# Patient Record
Sex: Male | Born: 2000 | Race: White | Hispanic: No | Marital: Single | Smoking: Current every day smoker
Health system: Southern US, Community
[De-identification: ages and names within clinical notes are randomized; demographics above are authoritative.]

## PROBLEM LIST (undated history)

## (undated) DIAGNOSIS — R519 Headache, unspecified: Secondary | ICD-10-CM

## (undated) HISTORY — DX: Headache, unspecified: R51.9

---
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
## 2019-06-29 ENCOUNTER — Emergency Department (HOSPITAL_COMMUNITY)
Admission: EM | Admit: 2019-06-29 | Discharge: 2019-06-29 | Disposition: A | Discharge status: Home / Self Care | Payer: No Typology Code available for payment source | Attending: Emergency Medicine | Admitting: Emergency Medicine

## 2019-06-29 ENCOUNTER — Encounter (HOSPITAL_COMMUNITY): Payer: Self-pay | Admitting: Emergency Medicine

## 2019-06-29 ENCOUNTER — Emergency Department (HOSPITAL_COMMUNITY): Payer: No Typology Code available for payment source

## 2019-06-29 DIAGNOSIS — Y999 Unspecified external cause status: Secondary | ICD-10-CM | POA: Diagnosis not present

## 2019-06-29 DIAGNOSIS — Y93I9 Activity, other involving external motion: Secondary | ICD-10-CM | POA: Diagnosis not present

## 2019-06-29 DIAGNOSIS — Y92410 Unspecified street and highway as the place of occurrence of the external cause: Secondary | ICD-10-CM | POA: Insufficient documentation

## 2019-06-29 DIAGNOSIS — M545 Low back pain, unspecified: Secondary | ICD-10-CM

## 2019-06-29 DIAGNOSIS — Z79899 Other long term (current) drug therapy: Secondary | ICD-10-CM | POA: Diagnosis not present
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
## 2019-06-29 NOTE — Discharge Instructions (Signed)
Please read attached information. If you experience any new or worsening signs or symptoms please return to the emergency room for evaluation. Please follow-up with your primary care provider or specialist as discussed.  °
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
## 2021-06-05 ENCOUNTER — Encounter (HOSPITAL_COMMUNITY): Payer: Self-pay

## 2021-06-05 ENCOUNTER — Other Ambulatory Visit: Payer: Self-pay

## 2021-06-05 ENCOUNTER — Emergency Department (HOSPITAL_COMMUNITY)
Admission: EM | Admit: 2021-06-05 | Discharge: 2021-06-06 | Disposition: A | Discharge status: Home / Self Care | Payer: Medicaid Other | Attending: Emergency Medicine | Admitting: Emergency Medicine

## 2021-06-05 ENCOUNTER — Emergency Department (HOSPITAL_COMMUNITY): Payer: Medicaid Other

## 2021-06-05 DIAGNOSIS — R2232 Localized swelling, mass and lump, left upper limb: Secondary | ICD-10-CM | POA: Insufficient documentation

## 2021-06-05 DIAGNOSIS — R61 Generalized hyperhidrosis: Secondary | ICD-10-CM | POA: Insufficient documentation

## 2021-06-05 DIAGNOSIS — R5383 Other fatigue: Secondary | ICD-10-CM | POA: Insufficient documentation

## 2021-06-05 DIAGNOSIS — Z5321 Procedure and treatment not carried out due to patient leaving prior to being seen by health care provider: Secondary | ICD-10-CM | POA: Insufficient documentation

## 2021-06-05 DIAGNOSIS — R630 Anorexia: Secondary | ICD-10-CM | POA: Insufficient documentation

## 2021-06-05 LAB — CBC WITH DIFFERENTIAL/PLATELET
Abs Immature Granulocytes: 0.01 10*3/uL (ref 0.00–0.07)
Basophils Absolute: 0 10*3/uL (ref 0.0–0.1)
Basophils Relative: 1 %
Eosinophils Absolute: 0 10*3/uL (ref 0.0–0.5)
Eosinophils Relative: 0 %
HCT: 43.6 % (ref 39.0–52.0)
Hemoglobin: 14.1 g/dL (ref 13.0–17.0)
Immature Granulocytes: 0 %
Lymphocytes Relative: 35 %
Lymphs Abs: 2.3 10*3/uL (ref 0.7–4.0)
MCH: 30.3 pg (ref 26.0–34.0)
MCHC: 32.3 g/dL (ref 30.0–36.0)
MCV: 93.6 fL (ref 80.0–100.0)
Monocytes Absolute: 0.7 10*3/uL (ref 0.1–1.0)
Monocytes Relative: 10 %
Neutro Abs: 3.6 10*3/uL (ref 1.7–7.7)
Neutrophils Relative %: 54 %
Platelets: 236 10*3/uL (ref 150–400)
RBC: 4.66 MIL/uL (ref 4.22–5.81)
RDW: 12.5 % (ref 11.5–15.5)
WBC: 6.6 10*3/uL (ref 4.0–10.5)
nRBC: 0 % (ref 0.0–0.2)

## 2021-06-05 LAB — COMPREHENSIVE METABOLIC PANEL
ALT: 15 U/L (ref 0–44)
AST: 21 U/L (ref 15–41)
Albumin: 4.4 g/dL (ref 3.5–5.0)
Alkaline Phosphatase: 51 U/L (ref 38–126)
Anion gap: 7 (ref 5–15)
BUN: 15 mg/dL (ref 6–20)
CO2: 24 mmol/L (ref 22–32)
Calcium: 9.2 mg/dL (ref 8.9–10.3)
Chloride: 106 mmol/L (ref 98–111)
Creatinine, Ser: 0.82 mg/dL (ref 0.61–1.24)
GFR, Estimated: >60 mL/min (ref >60)
Glucose, Bld: 97 mg/dL (ref 70–99)
Potassium: 3.8 mmol/L (ref 3.5–5.1)
Sodium: 137 mmol/L (ref 135–145)
Total Bilirubin: 0.4 mg/dL (ref 0.3–1.2)
Total Protein: 7 g/dL (ref 6.5–8.1)

## 2021-06-05 LAB — HIV ANTIBODY (ROUTINE TESTING W REFLEX): HIV Screen 4th Generation wRfx: NONREACTIVE

## 2021-06-05 NOTE — ED Triage Notes (Signed)
Pt presents with lump under his Left arm x1 week. Pt unable to lift his arm d/t increase pain. Reports hx of swollen neck glands in the past. Pt also c/o increase sweating at night, states, "I'm soaking my sheets", decrease appetite and more lethargic than normal

## 2021-06-05 NOTE — ED Provider Notes (Signed)
Emergency Medicine Provider Triage Evaluation Note  Henry Bailey , a 20 y.o. male  was evaluated in triage.  Pt complains of swelling to left axilla.  Also endorses night sweats x1 week, chills, decreased appetite, and fatigue.  Denies any cough, hemoptysis, unexpected weight loss, recent international travel.  Review of Systems  Positive: Swelling left axilla, night sweats, chills, decreased appetite, fatigue Negative: Cough, hemoptysis, unexpected weight loss  Physical Exam  BP 130/72 (BP Location: Right Arm)   Pulse 73   Temp 98.6 F (37 C) (Oral)   Resp 14   SpO2 97%  Gen:   Awake, no distress   Resp:  Normal effort, lungs clear to auscultation bilaterally. MSK:   Moves extremities without difficulty  Other:  Patient has swelling to left axilla, no fluctuance, erythema, or rash noted.  No cervical lymphadenopathy.  Neck supple, full range of motion.  Medical Decision Making  Medically screening exam initiated at 5:49 PM.  Appropriate orders placed.  Reef Achterberg was informed that the remainder of the evaluation will be completed by another provider, this initial triage assessment does not replace that evaluation, and the importance of remaining in the ED until their evaluation is complete.  The patient appears stable so that the remainder of the work up may be completed by another provider.      Haskel Schroeder, PA-C 06/05/21 1750    Maia Plan, MD 06/05/21 [phone]

## 2021-06-05 NOTE — ED Notes (Signed)
Patient called three times for vitals with no response

## 2021-06-06 ENCOUNTER — Emergency Department (HOSPITAL_COMMUNITY)
Admission: EM | Admit: 2021-06-06 | Discharge: 2021-06-06 | Disposition: A | Discharge status: Home / Self Care | Payer: Medicaid Other | Attending: Emergency Medicine | Admitting: Emergency Medicine

## 2021-06-06 ENCOUNTER — Other Ambulatory Visit: Payer: Self-pay

## 2021-06-06 ENCOUNTER — Encounter (HOSPITAL_COMMUNITY): Payer: Self-pay | Admitting: Emergency Medicine

## 2021-06-06 DIAGNOSIS — Z5321 Procedure and treatment not carried out due to patient leaving prior to being seen by health care provider: Secondary | ICD-10-CM | POA: Insufficient documentation

## 2021-06-06 DIAGNOSIS — N6331 Unspecified lump in axillary tail of the right breast: Secondary | ICD-10-CM | POA: Insufficient documentation

## 2021-06-06 NOTE — ED Notes (Signed)
Called pt 3 x no answer

## 2021-06-06 NOTE — ED Triage Notes (Signed)
Pt states he was seen in ED last night and left due to wait.  Reports lump to L axilla x 1 week.  States he has been having night sweats.  Denies fever.  Labs obtained yesterday.

## 2021-06-06 NOTE — ED Notes (Signed)
Called pt again without answer pulling pt off the floor

## 2022-03-05 ENCOUNTER — Ambulatory Visit (HOSPITAL_COMMUNITY)
Admission: EM | Admit: 2022-03-05 | Discharge: 2022-03-05 | Disposition: A | Discharge status: Home / Self Care | Payer: Self-pay | Attending: Family Medicine | Admitting: Family Medicine

## 2022-03-05 ENCOUNTER — Ambulatory Visit (INDEPENDENT_AMBULATORY_CARE_PROVIDER_SITE_OTHER): Payer: Self-pay

## 2022-03-05 ENCOUNTER — Encounter (HOSPITAL_COMMUNITY): Payer: Self-pay | Admitting: *Deleted

## 2022-03-05 ENCOUNTER — Other Ambulatory Visit: Payer: Self-pay

## 2022-03-05 DIAGNOSIS — M79641 Pain in right hand: Secondary | ICD-10-CM

## 2022-03-05 MED ORDER — IBUPROFEN 800 MG PO TABS: 800.0000 mg | ORAL_TABLET | Freq: Three times a day (TID) | ORAL | 0 refills | Status: DC | PRN

## 2022-03-05 NOTE — ED Provider Notes (Signed)
[facility]    CSN: [id number] Arrival date & time: 03/05/22  1223      History   Chief Complaint Chief Complaint  Patient presents with   Hand Pain    HPI Henry Bailey is a 21 y.o. male.    Hand Pain  Here for right hand pain that he first noted this morning.  Since injury that he remembers.  He does work Holiday representative.  He has injured it twice in the past the last time was about a year ago.  He does report that 1 time there was a fractur in his fifth metacarpal.  History reviewed. No pertinent past medical history.  There are no problems to display for this patient.   History reviewed. No pertinent surgical history.     Home Medications    Prior to Admission medications   Medication Sig Start Date End Date Taking? Authorizing Provider  ibuprofen (ADVIL) 800 MG tablet Take 1 tablet (800 mg total) by mouth every 8 (eight) hours as needed (pain). 03/05/22  Yes Zenia Resides, MD  acetaminophen (TYLENOL) 500 MG tablet Take 1,000 mg by mouth every 6 (six) hours as needed. For pain    [provider]  lisdexamfetamine (VYVANSE) 40 MG capsule Take 40 mg by mouth every morning.    [provider]    Family History History reviewed. No pertinent family history.  Social History Social History   Tobacco Use   Smoking status: Never   Smokeless tobacco: Never  Substance Use Topics   Alcohol use: Never   Drug use: Never     Allergies   Penicillins   Review of Systems Review of Systems   Physical Exam Triage Vital Signs ED Triage Vitals  Enc Vitals Group     BP 03/05/22 1356 132/74     Pulse Rate 03/05/22 1356 71     Resp 03/05/22 1356 18     Temp 03/05/22 1356 98.5 F (36.9 C)     Temp src --      SpO2 03/05/22 1356 100 %     Weight --      Height --      Head Circumference --      Peak Flow --      Pain Score 03/05/22 1354 7     Pain Loc --      Pain Edu? --      Excl. in GC? --    No data  found.  Updated Vital Signs BP 132/74   Pulse 71   Temp 98.5 F (36.9 C)   Resp 18   SpO2 100%   Visual Acuity Right Eye Distance:   Left Eye Distance:   Bilateral Distance:    Right Eye Near:   Left Eye Near:    Bilateral Near:     Physical Exam Vitals reviewed.  Constitutional:      General: He is not in acute distress.    Appearance: He is not toxic-appearing.  Musculoskeletal:     Comments: There is a nodular deformity in the area of the proximal fifth metacarpal on his right hand.  He is able to flex and extend his fingers though it does cause some discomfort.  He also notes some tenderness in the lateral border of the hand  Neurological:     General: No focal deficit present.     Mental Status: He is alert and oriented to person, place, and time.  Psychiatric:  Behavior: Behavior normal.     UC Treatments / Results  Labs (all labs ordered are listed, but only abnormal results are displayed) Labs Reviewed - No data to display  EKG   Radiology DG Hand Complete Right  Result Date: 03/05/2022 CLINICAL DATA:  Right hand pain along the ulnar aspect. Prior fracture. EXAM: RIGHT HAND - COMPLETE 3+ VIEW COMPARISON:  Right wrist radiographs 02/13/2021 FINDINGS: No acute fracture or dislocation is identified. Chronic deformity of the proximal aspect of the fifth metacarpal with marginal spurring is similar to the prior study. The soft tissues are unremarkable. IMPRESSION: No acute osseous abnormality identified. Electronically Signed   By: Sebastian Ache M.D.   On: 03/05/2022 14:26    Procedures Procedures (including critical care time)  Medications Ordered in UC Medications - No data to display  Initial Impression / Assessment and Plan / UC Course  I have reviewed the triage vital signs and the nursing notes.  Pertinent labs & imaging results that were available during my care of the patient were reviewed by me and considered in my medical decision making (see  chart for details).      X-ray does not show any new fracture.  There is arthritic deformity where he had the boxer's fracture previously nal Clinical Impressions(s) / UC Diagnoses   Final diagnoses:  Right hand pain     Discharge Instructions      Your x-ray did not show any new broken bone.  There is arthritis around where you broke the bone previously  Take ibuprofen 800 mg--1 tab every 8 hours as needed for pain.        ED Prescriptions     Medication Sig Dispense Auth. Provider   ibuprofen (ADVIL) 800 MG tablet Take 1 tablet (800 mg total) by mouth every 8 (eight) hours as needed (pain). 21 tablet Theophil Thivierge, Janace Aris, MD      PDMP not reviewed this encounter.   Zenia Resides, MD 03/05/22 [phone]

## 2022-03-05 NOTE — Discharge Instructions (Addendum)
Your x-ray did not show any new broken bone.  There is arthritis around where you broke the bone previously  Take ibuprofen 800 mg--1 tab every 8 hours as needed for pain.

## 2022-03-05 NOTE — ED Triage Notes (Signed)
Pain to rt hand that started today. Pt reports he has a broken rt hans x2 in the past.

## 2022-05-16 ENCOUNTER — Emergency Department (HOSPITAL_COMMUNITY)
Admission: EM | Admit: 2022-05-16 | Discharge: 2022-05-16 | Disposition: A | Discharge status: Home / Self Care | Payer: Medicaid Other | Attending: Emergency Medicine | Admitting: Emergency Medicine

## 2022-05-16 ENCOUNTER — Other Ambulatory Visit: Payer: Self-pay

## 2022-05-16 ENCOUNTER — Encounter (HOSPITAL_COMMUNITY): Payer: Self-pay | Admitting: Emergency Medicine

## 2022-05-16 DIAGNOSIS — R079 Chest pain, unspecified: Secondary | ICD-10-CM | POA: Insufficient documentation

## 2022-05-16 DIAGNOSIS — R1902 Left upper quadrant abdominal swelling, mass and lump: Secondary | ICD-10-CM | POA: Insufficient documentation

## 2022-05-16 DIAGNOSIS — K5900 Constipation, unspecified: Secondary | ICD-10-CM | POA: Insufficient documentation

## 2022-05-16 DIAGNOSIS — R1011 Right upper quadrant pain: Secondary | ICD-10-CM | POA: Insufficient documentation

## 2022-05-16 DIAGNOSIS — Z5321 Procedure and treatment not carried out due to patient leaving prior to being seen by health care provider: Secondary | ICD-10-CM | POA: Insufficient documentation

## 2022-05-16 NOTE — ED Triage Notes (Signed)
Patient from home and noticed a "lump" underneath his R breast bone area/ upper rib cage that he noticed while coughing a week ago. Denies trauma to the area. Feels like this is a constant pressure. Aox4. No apparent distress to this time.

## 2022-05-16 NOTE — ED Provider Triage Note (Signed)
Emergency Medicine Provider Triage Evaluation Note  Henry Bailey , a 21 y.o. male  was evaluated in triage.  Pt complains of upper abdominal lump associated pain.  He notes this lump yesterday.  Pain is located in the RUQ and radiates to the back and lower part of his chest.  Pain is dull and constant.  Denies nausea fever diarrhea or vomiting.  Endorses constipation.  Endorses vaping.  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  Denies chest pain, shortness of breath.  Review of Systems  Positive: As above Negative: As above  Physical Exam  BP 128/85 (BP Location: Right Arm)   Pulse 83   Temp 98.9 F (37.2 C) (Oral)   Resp 16   SpO2 96%  Gen:   Awake, no distress   Resp:  Normal effort  Abd:                could not appreciate a lump MSK:   Moves extremities without difficulty  Other:    Medical Decision Making  Medically screening exam initiated at 10:39 AM.  Appropriate orders placed.  Henry Bailey was informed that the remainder of the evaluation will be completed by another provider, this initial triage assessment does not replace that evaluation, and the importance of remaining in the ED until their evaluation is complete.  Ordered EKG and abdominal labs.   Gareth Eagle, PA-C 05/16/22 1045

## 2023-10-22 IMAGING — DX DG HAND COMPLETE 3+V*R*
3 series · 3 of 3 positions shown · non-contrast
Comparison: Right wrist radiographs 02/13/2021

CLINICAL DATA: Right hand pain along the ulnar aspect. Prior
fracture.

EXAM:
RIGHT HAND - COMPLETE 3+ VIEW

[hand pa]
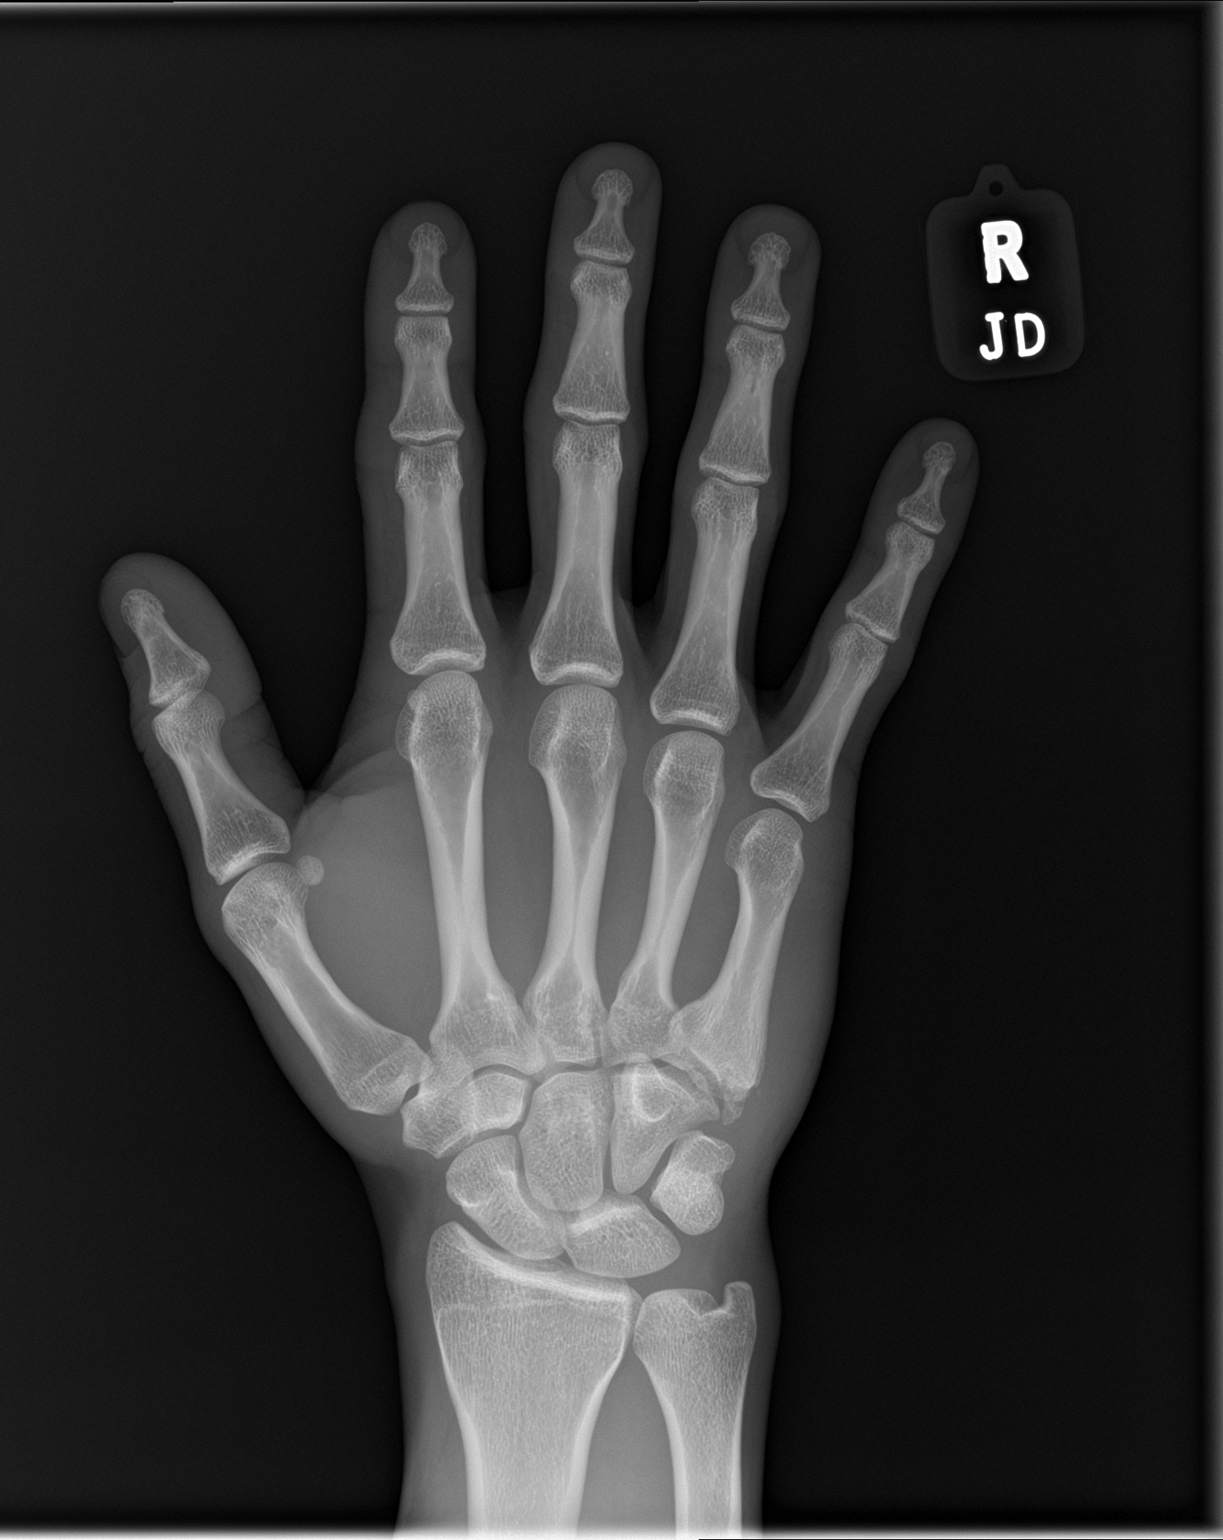

[hand obl]
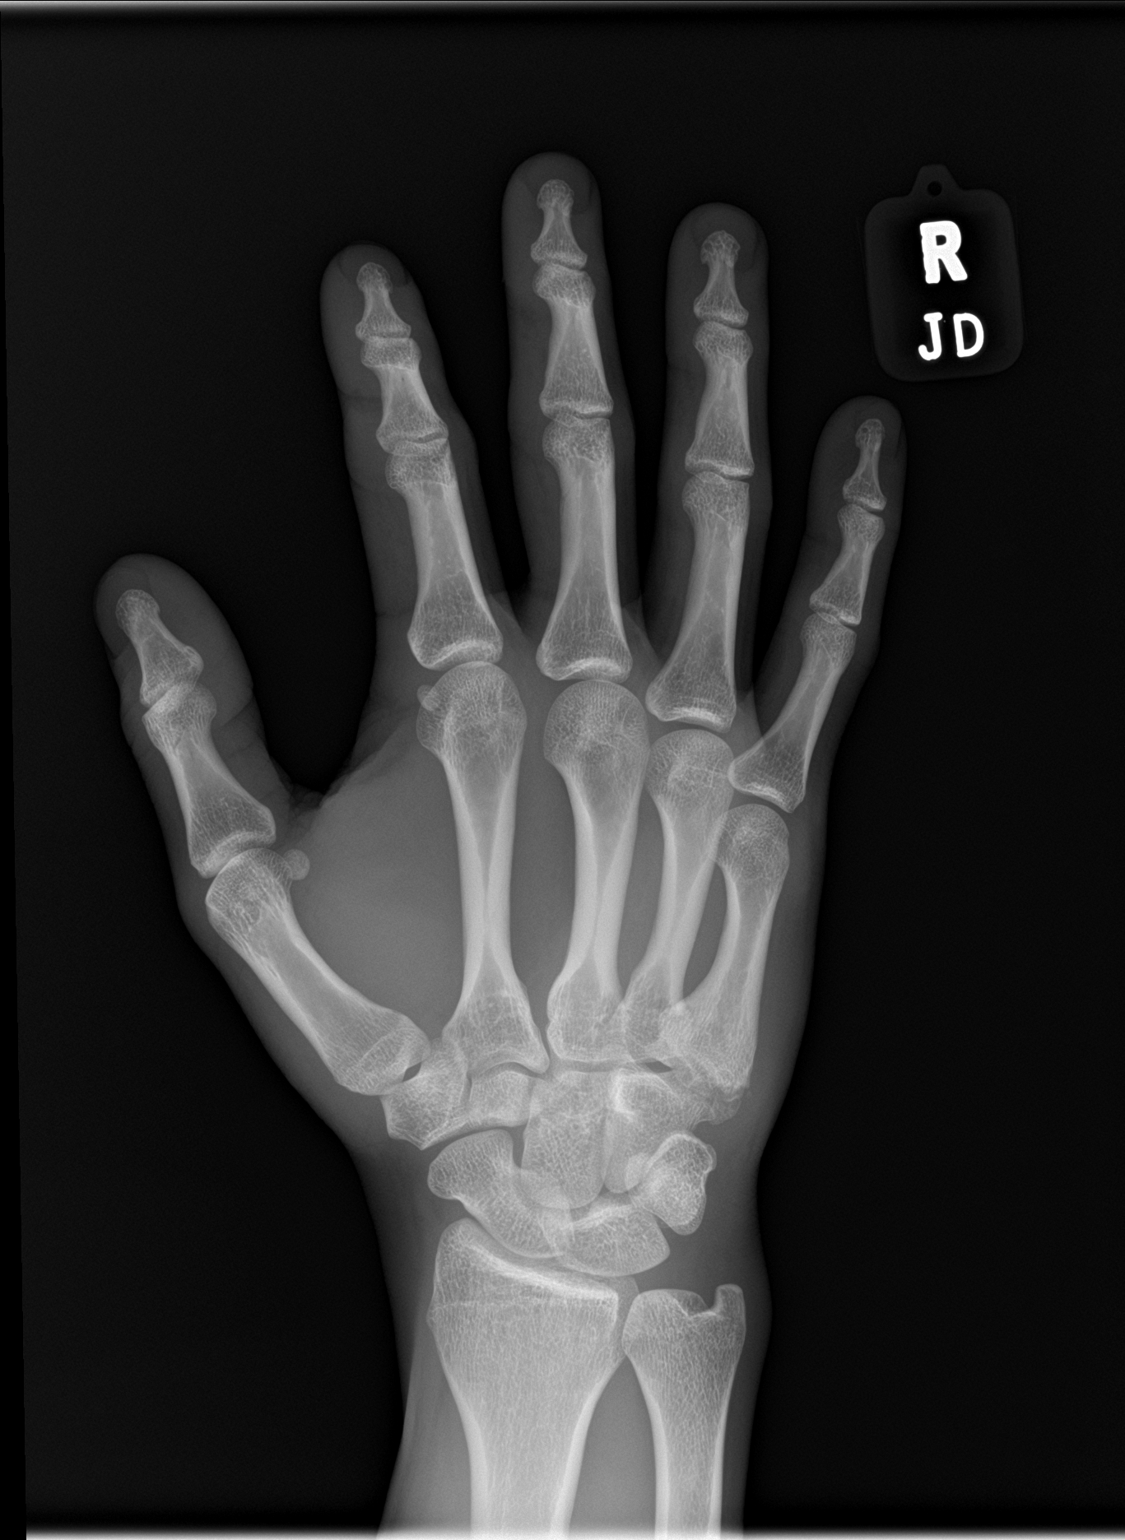

[hand lat]
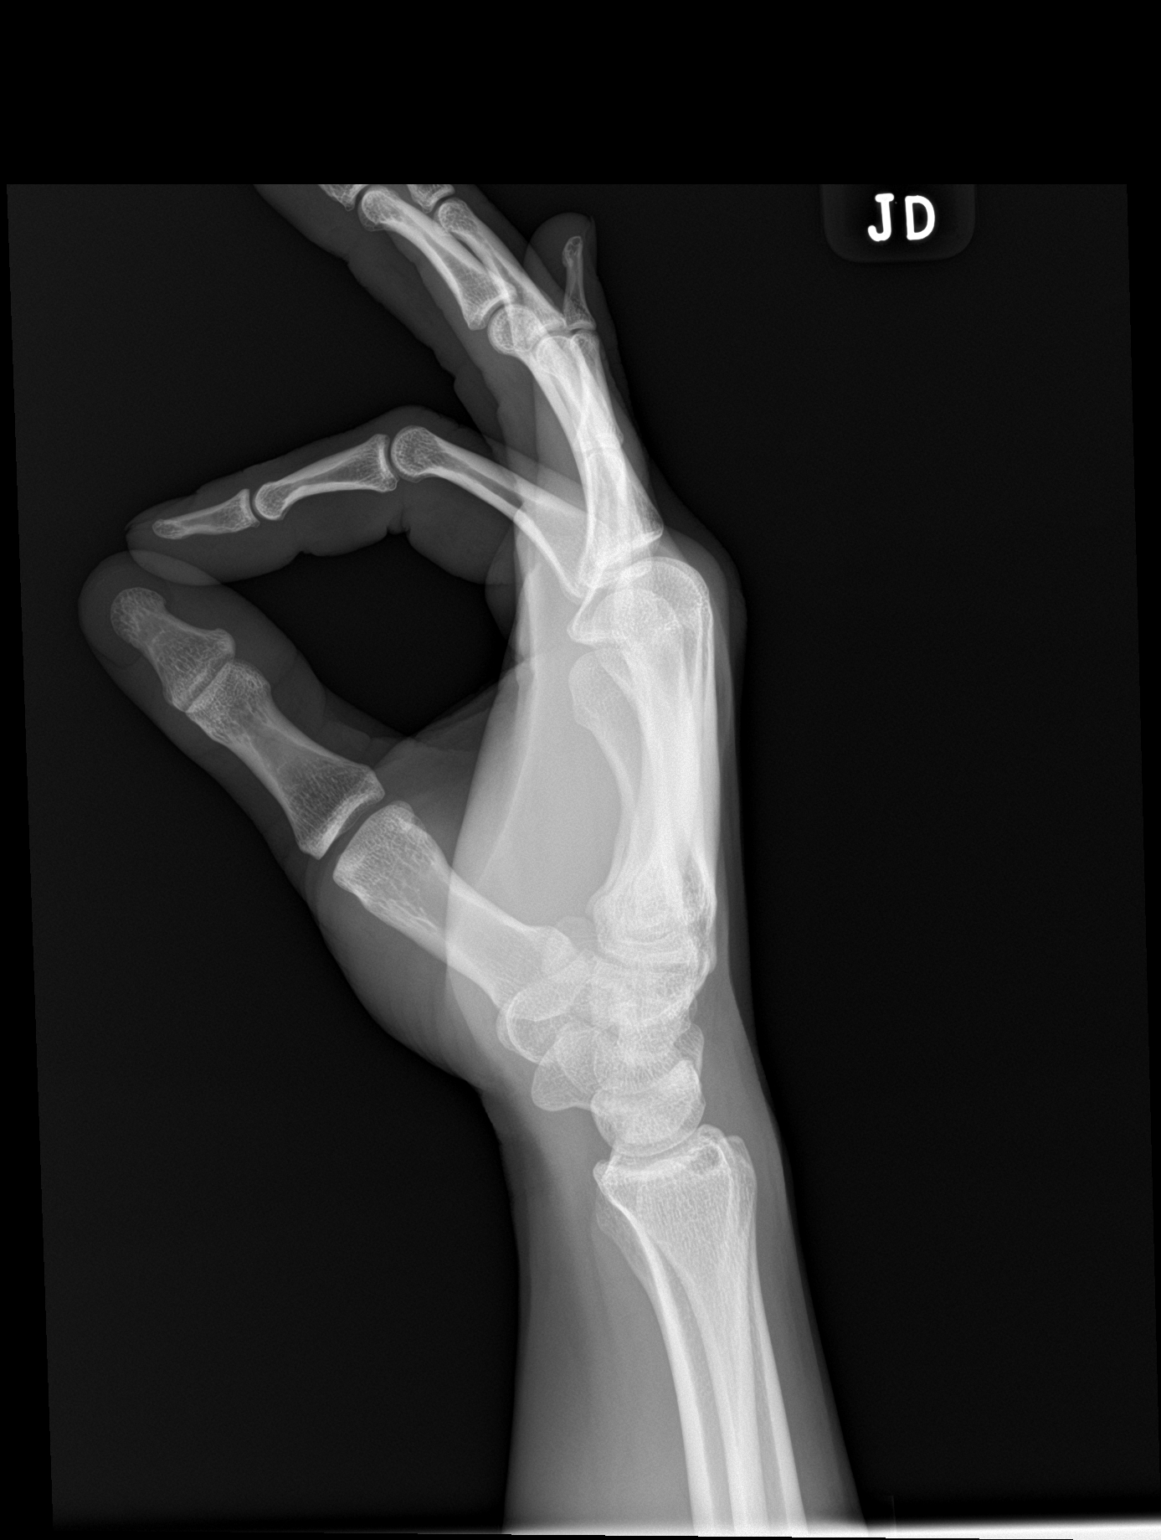

[3 of 3 positions shown; findings below may reference images not displayed]

FINDINGS: No acute fracture or dislocation is identified. Chronic deformity of
the proximal aspect of the fifth metacarpal with marginal spurring
is similar to the prior study. The soft tissues are unremarkable.
IMPRESSION: No acute osseous abnormality identified.

## 2024-05-18 ENCOUNTER — Emergency Department
Admission: EM | Admit: 2024-05-18 | Discharge: 2024-05-18 | Disposition: A | Discharge status: Home / Self Care | Attending: Emergency Medicine | Admitting: Emergency Medicine

## 2024-05-18 ENCOUNTER — Other Ambulatory Visit: Payer: Self-pay

## 2024-05-18 DIAGNOSIS — R197 Diarrhea, unspecified: Secondary | ICD-10-CM | POA: Insufficient documentation

## 2024-05-18 DIAGNOSIS — R112 Nausea with vomiting, unspecified: Secondary | ICD-10-CM | POA: Insufficient documentation

## 2024-05-18 DIAGNOSIS — R1013 Epigastric pain: Secondary | ICD-10-CM | POA: Insufficient documentation

## 2024-05-18 DIAGNOSIS — R748 Abnormal levels of other serum enzymes: Secondary | ICD-10-CM | POA: Diagnosis not present

## 2024-05-18 LAB — URINALYSIS, ROUTINE W REFLEX MICROSCOPIC
Bacteria, UA: NONE SEEN
Bilirubin Urine: NEGATIVE
Glucose, UA: NEGATIVE mg/dL
Ketones, ur: NEGATIVE mg/dL
Leukocytes,Ua: NEGATIVE
Nitrite: NEGATIVE
Protein, ur: NEGATIVE mg/dL
Specific Gravity, Urine: 1.021 (ref 1.005–1.030)
Squamous Epithelial / HPF: 0 /HPF (ref 0–5)
pH: 5 (ref 5.0–8.0)

## 2024-05-18 LAB — COMPREHENSIVE METABOLIC PANEL WITH GFR
ALT: 27 U/L (ref 0–44)
AST: 23 U/L (ref 15–41)
Albumin: 4.7 g/dL (ref 3.5–5.0)
Alkaline Phosphatase: 41 U/L (ref 38–126)
Anion gap: 7 (ref 5–15)
BUN: 13 mg/dL (ref 6–20)
CO2: 27 mmol/L (ref 22–32)
Calcium: 9.3 mg/dL (ref 8.9–10.3)
Chloride: 108 mmol/L (ref 98–111)
Creatinine, Ser: 0.93 mg/dL (ref 0.61–1.24)
GFR, Estimated: >60 mL/min (ref >60)
Glucose, Bld: 106 mg/dL — ABNORMAL HIGH (ref 70–99)
Potassium: 4.2 mmol/L (ref 3.5–5.1)
Sodium: 142 mmol/L (ref 135–145)
Total Bilirubin: <0.2 mg/dL (ref 0.0–1.2)
Total Protein: 7.2 g/dL (ref 6.5–8.1)

## 2024-05-18 LAB — CBC
HCT: 42.9 % (ref 39.0–52.0)
Hemoglobin: 14.4 g/dL (ref 13.0–17.0)
MCH: 31.2 pg (ref 26.0–34.0)
MCHC: 33.6 g/dL (ref 30.0–36.0)
MCV: 92.9 fL (ref 80.0–100.0)
Platelets: 257 K/uL (ref 150–400)
RBC: 4.62 MIL/uL (ref 4.22–5.81)
RDW: 12.6 % (ref 11.5–15.5)
WBC: 6.3 K/uL (ref 4.0–10.5)
nRBC: 0 % (ref 0.0–0.2)

## 2024-05-18 LAB — LIPASE, BLOOD: Lipase: 175 U/L — ABNORMAL HIGH (ref 11–51)

## 2024-05-18 MED ORDER — ONDANSETRON HCL 4 MG PO TABS: 4.0000 mg | ORAL_TABLET | Freq: Four times a day (QID) | ORAL | 0 refills | Status: AC | PRN

## 2024-05-18 MED ORDER — ONDANSETRON 4 MG PO TBDP
4.0000 mg | ORAL_TABLET | Freq: Once | ORAL | Status: AC
  Administered 2024-05-18: 4 mg via ORAL
  Filled 2024-05-18: qty 1

## 2024-05-18 NOTE — ED Provider Notes (Signed)
 [facility] Provider Note    Event Date/Time   First MD Initiated Contact with Patient 05/18/24 [phone]     (approximate)   History   Abdominal Pain and Emesis   HPI  Henry Bailey is a 23 year old male presenting to the emergency department for evaluation of abdominal pain with associated vomiting and diarrhea.  Symptoms started on Saturday.  Thinks he may have ate something that made him get sick.  Has reported intermittent episodes of epigastric pain.  Says he was seen in the past and thinks he may have a hiatal hernia, but is unsure if any imaging was done.  Reports diarrhea is improving, but continues to have episodes of vomiting.  No fevers.  No known sick contacts.  No dysuria or other urinary symptoms.     Physical Exam   Triage Vital Signs: ED Triage Vitals [05/18/24 0947]  Encounter Vitals Group     BP 137/78     Girls Systolic BP Percentile      Girls Diastolic BP Percentile      Boys Systolic BP Percentile      Boys Diastolic BP Percentile      Pulse Rate 86     Resp 18     Temp 98.4 F (36.9 C)     Temp src      SpO2 98 %     Weight 138 lb (62.6 kg)     Height 5' 7 (1.702 m)     Head Circumference      Peak Flow      Pain Score 4     Pain Loc      Pain Education      Exclude from Growth Chart     Most recent vital signs: Vitals:   05/18/24 0947  BP: 137/78  Pulse: 86  Resp: 18  Temp: 98.4 F (36.9 C)  SpO2: 98%     General: Awake, interactive  CV:  Regular rate, good peripheral perfusion.  Resp:  Unlabored respirations, lungs clear to auscultation Abd:  Nondistended, soft, no significant tenderness to palpation including in the epigastric region Neuro:  Symmetric facial movement, fluid speech   ED Results / Procedures / Treatments   Labs (all labs ordered are listed, but only abnormal results are displayed) Labs Reviewed  LIPASE, BLOOD - Abnormal; Notable for the following components:      Result Value    Lipase 175 (*)    All other components within normal limits  COMPREHENSIVE METABOLIC PANEL WITH GFR - Abnormal; Notable for the following components:   Glucose, Bld 106 (*)    All other components within normal limits  URINALYSIS, ROUTINE W REFLEX MICROSCOPIC - Abnormal; Notable for the following components:   Color, Urine YELLOW (*)    APPearance CLEAR (*)    Hgb urine dipstick SMALL (*)    All other components within normal limits  CBC     EKG EKG independently reviewed and interpreted by myself demonstrates:  EKG demonstrates normal sinus rhythm rate of 78, PR 144, QRS 92, QTc 401, nonspecific ST changes, no STEMI  RADIOLOGY Imaging independently reviewed and interpreted by myself demonstrates:   Formal Radiology Read:  No results found.  PROCEDURES:  Critical Care performed: No  Procedures   MEDICATIONS ORDERED IN ED: Medications  ondansetron  (ZOFRAN -ODT) disintegrating tablet 4 mg (4 mg Oral Given 05/18/24 1014)     IMPRESSION / MDM / ASSESSMENT AND PLAN / ED COURSE  I reviewed the  triage vital signs and the nursing notes.  Differential diagnosis includes, but is not limited to, gastritis, pancreatitis, foodborne illness, viral GI illness, lower suspicion for biliary or other acute intra-abdominal process given reassuring abdominal exam  Patient's presentation is most consistent with acute presentation with potential threat to life or bodily function.  23 year old male presenting to the emergency department for evaluation of abdominal pain with associated vomiting and diarrhea.  Stable vitals on presentation.  With reassuring CBC, CMP.  Lipase is elevated at 175, just over 3 times the upper limit of normal, but clinically patient without any significant abdominal pain here and presentation overall less suggestive of pancreatitis.  Suspect likely elevated related to recent episodes of vomiting.  I did discuss IV placement for fluids, antiemetics but patient preferred  to hold off on this and trial oral Zofran .  After receiving the Zofran  he did report resolution of his Zofran  and was able to tolerate p.o.  Continues to deny any ongoing abdominal pain.  Urine without evidence of infection. I did discuss the results of his workup.  I considered admission, but I have low suspicion for acute pancreatitis with his current clinical presentation. Both his pain and nausea are well controlled.  Patient is comfortable with discharge home with antiemetics.  Offered to discharge with pain medication but patient declines.  Strict return precautions were provided.  Patient was discharged stable condition.      FINAL CLINICAL IMPRESSION(S) / ED DIAGNOSES   Final diagnoses:  Nausea vomiting and diarrhea  Epigastric pain  Elevated lipase     Rx / DC Orders   ED Discharge Orders          Ordered    ondansetron  (ZOFRAN ) 4 MG tablet  Every 6 hours PRN        05/18/24 1054             Note:  This document was prepared using Dragon voice recognition software and may include unintentional dictation errors.   Levander Slate, MD 05/18/24 [phone]

## 2024-05-18 NOTE — Discharge Instructions (Signed)
 You are seen in the emergency room today for evaluation of your abdominal pain and vomiting.  Your testing and exam were fortunately overall reassuring.  I sent a prescription for nausea medicine to your pharmacy that you can take as needed.  If you develop worsening abdominal pain, are unable to tolerate food or liquids despite nausea medication, or develop any other new or concerning symptoms, please return to the ER for further evaluation.

## 2024-05-18 NOTE — ED Triage Notes (Signed)
 Pt comes in via pov with complaints of n/v/d that started Saturday. Pt states that he had a bout of diarrhea and vomiting this morning while at work this morning. Pt unsure if he ate something that caused him to get sick. Pt has complaints of  epigastric chest pain 4/10. Pt with no signs of acute distress at this time.

## 2024-05-19 ENCOUNTER — Ambulatory Visit: Payer: Self-pay

## 2024-05-19 NOTE — Telephone Encounter (Signed)
 Patient was seen in ER yesterday; not sure if he needs to be triaged or what. His appt with [facility] isnt until 07/07/24.

## 2024-05-19 NOTE — Telephone Encounter (Signed)
 FYI Only or Action Required?: FYI only for provider.  Patient was last seen in primary care on [place] appointment.  Called Nurse Triage reporting No chief complaint on file..  Symptoms began several months ago.  Interventions attempted: Rest, hydration, or home remedies and Dietary changes.  Symptoms are: unchanged.  Triage Disposition: See PCP Within 2 Weeks  Patient/caregiver understands and will follow disposition?: Yes   Copied from CRM #[id number]. 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 Seen yesterday, dx w/ pancreatitis.  Pt feels chest tightness,and wants to know if a good idea to continue this type of work. Reason for Disposition  Nausea is a chronic symptom (recurrent or ongoing AND present > 4 weeks)  Answer Assessment - Initial Assessment Questions 1. NAUSEA SEVERITY: How bad is the nausea? (e.g., mild, moderate, severe; dehydration, weight loss)      Moderate to Severe  2. ONSET: When did the nausea begin?     Ongoing for months now  3. VOMITING: Any vomiting? If Yes, ask: How many times today?     At least once daily  4. RECURRENT SYMPTOM: Have you had nausea before? If Yes, ask: When was the last time? What happened that time?     Not before this chronic nausea no, recently diagnosed with pancreatitis along with elevated lipase levels during recent ER visit.  5. CAUSE: What do you think is causing the nausea?     Unsure  Protocols used: Nausea-A-AH

## 2024-07-07 ENCOUNTER — Encounter: Payer: Self-pay | Admitting: General Practice

## 2024-07-07 ENCOUNTER — Ambulatory Visit (INDEPENDENT_AMBULATORY_CARE_PROVIDER_SITE_OTHER): Admitting: General Practice

## 2024-07-07 VITALS — BP 120/72 | HR 93 | Temp 98.2°F | Ht 66.1 in | Wt 134.0 lb

## 2024-07-07 DIAGNOSIS — R110 Nausea: Secondary | ICD-10-CM | POA: Diagnosis not present

## 2024-07-07 DIAGNOSIS — R748 Abnormal levels of other serum enzymes: Secondary | ICD-10-CM | POA: Diagnosis not present

## 2024-07-07 DIAGNOSIS — R6889 Other general symptoms and signs: Secondary | ICD-10-CM

## 2024-07-07 DIAGNOSIS — Z1159 Encounter for screening for other viral diseases: Secondary | ICD-10-CM

## 2024-07-07 DIAGNOSIS — Z7689 Persons encountering health services in other specified circumstances: Secondary | ICD-10-CM | POA: Insufficient documentation

## 2024-07-07 MED ORDER — ONDANSETRON HCL 4 MG PO TABS: 4.0000 mg | ORAL_TABLET | Freq: Three times a day (TID) | ORAL | 0 refills | Status: DC | PRN

## 2024-07-07 NOTE — Assessment & Plan Note (Signed)
 Unclear etiology.  Reviewed hospital labs.   TSH pending.

## 2024-07-07 NOTE — Progress Notes (Signed)
 New Patient Office Visit  Subjective    Patient ID: Henry Bailey, male    DOB: 04/01/2001  Age: 23 y.o. MRN: [id number]  CC:  Chief Complaint  Patient presents with   New Patient (Initial Visit)   Nausea    Has a hx of nausea and abdominal pain; was given zofran  in the hospital for this and also due to his lipase being elevated. Patient wants to see about have pancreas checked again. Also has a hx of heavy drinking.     HPI Henry Bailey is a 23 y.o. male presents to establish care. Previous pcp/physical/labs: [place] pediatrician.   Discussed the use of AI scribe software for clinical note transcription with the patient, who gave verbal consent to proceed.  History of Present Illness Henry Bailey is a 23 year old male who presents for follow-up after an ER visit for vomiting and abdominal pain.  He visited the emergency room on August 19th at [facility] due to vomiting and epigastric pain. He was concerned about a possible hernia. He also experienced diarrhea, which has since resolved. An EKG showed normal sinus rhythm, and blood work including a complete blood count, kidney, liver, and electrolyte panels were normal. His lipase level was elevated, and the ER was concerned about possible pancreatitis. He was discharged with instructions to rest and avoid core exercises.  He was prescribed Zofran  for nausea and was able to tolerate food before discharge. He experiences intermittent nausea, particularly when overheated, affecting his appetite. He has not had further significant abdominal pain, only occasional mild pain that resolves quickly. No current diarrhea or constipation.  He has a history of heavy alcohol intake but stopped alcohol use since the weekend before his ER visit. 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     Outpatient Encounter Medications as of 07/07/2024  Medication  Sig   [DISCONTINUED] ondansetron  (ZOFRAN ) 4 MG tablet Take 4 mg by mouth every 8 (eight) hours as needed for nausea or vomiting.   ondansetron  (ZOFRAN ) 4 MG tablet Take 1 tablet (4 mg total) by mouth every 8 (eight) hours as needed for nausea or vomiting.   [DISCONTINUED] acetaminophen (TYLENOL) 500 MG tablet Take 1,000 mg by mouth every 6 (six) hours as needed. For pain   [DISCONTINUED] ibuprofen  (ADVIL ) 800 MG tablet Take 1 tablet (800 mg total) by mouth every 8 (eight) hours as needed (pain).   [DISCONTINUED] lisdexamfetamine (VYVANSE) 40 MG capsule Take 40 mg by mouth every morning.   No facility-administered encounter medications on file as of 07/07/2024.    Past Medical History:  Diagnosis Date   Frequent headaches     History reviewed. No pertinent surgical history.  History reviewed. No pertinent family history.  Social History   Socioeconomic History   Marital status: Single    Spouse name: Rosina   Number of children: 1   Years of education: Not on file   Highest education level: High school graduate  Occupational History   Not on file  Tobacco Use   Smoking status: Every Day    Types: Cigarettes   Smokeless tobacco: Never  Vaping Use   Vaping status: Unknown  Substance and Sexual Activity   Alcohol use: Not Currently   Drug use: Never   Sexual activity: Yes  Other Topics Concern   Not on file  Social History Narrative   Not on file   Social Drivers of Health  Financial Resource Strain: Not on file  Food Insecurity: Not on file  Transportation Needs: Not on file  Physical Activity: Not on file  Stress: Not on file  Social Connections: Not on file  Intimate Partner Violence: Not on file    Review of Systems  Constitutional:  Negative for chills and fever.  Respiratory:  Negative for shortness of breath.   Cardiovascular:  Negative for chest pain.  Gastrointestinal:  Positive for nausea. Negative for abdominal pain, constipation, diarrhea, heartburn  and vomiting.  Genitourinary:  Negative for dysuria, frequency and urgency.  Neurological:  Negative for dizziness and headaches.  Endo/Heme/Allergies:  Negative for polydipsia.  Psychiatric/Behavioral:  Negative for depression and suicidal ideas. The patient is not nervous/anxious.         Objective    BP 120/72   Pulse 93   Temp 98.2 F (36.8 C) (Oral)   Ht 5' 6.1 (1.679 m)   Wt 134 lb (60.8 kg)   SpO2 98%   BMI 21.56 kg/m   Physical Exam Vitals and nursing note reviewed.  Constitutional:      Appearance: Normal appearance.  Cardiovascular:     Rate and Rhythm: Normal rate and regular rhythm.     Pulses: Normal pulses.     Heart sounds: Normal heart sounds.  Pulmonary:     Effort: Pulmonary effort is normal.     Breath sounds: Normal breath sounds.  Neurological:     Mental Status: He is alert and oriented to person, place, and time.  Psychiatric:        Mood and Affect: Mood normal.        Behavior: Behavior normal.        Thought Content: Thought content normal.        Judgment: Judgment normal.         Assessment & Plan:  Elevated lipase -     Lipase  Establishing care with new doctor, encounter for Assessment & Plan: EMR reviewed briefly.    Heat intolerance Assessment & Plan: Unclear etiology.  Reviewed hospital labs.   TSH pending.  Orders: -     TSH  Nausea -     Ondansetron  HCl; Take 1 tablet (4 mg total) by mouth every 8 (eight) hours as needed for nausea or vomiting.  Dispense: 20 tablet; Refill: 0  Need for hepatitis C screening test -     Hepatitis C antibody    Assessment and Plan Assessment & Plan Follow-up after recent acute pancreatitis episode Recent pancreatitis with resolved abdominal pain, intermittent nausea, and alcohol abstinence aiding recovery. - Lipase pending. - Advise continued alcohol abstinence. - refill sent for zofran . - He will update if he has any more symptoms.   Alcohol use, currently  abstinent Abstinence since pancreatitis episode crucial to prevent recurrence. - Continue alcohol abstinence. - Monitor for pancreatitis symptoms and seek medical attention if he reappears.   Return in about 4 weeks (around 08/04/2024) for physical .   Carrol Aurora, NP

## 2024-07-07 NOTE — Assessment & Plan Note (Signed)
 EMR reviewed briefly.

## 2024-07-07 NOTE — Patient Instructions (Signed)
 Stop by the lab prior to leaving today. I will notify you of your results once received.   Continue to abstain from alcohol.   Follow up in 3-4 weeks for physical.   It was a pleasure to meet you today! Please don't hesitate to contact me with any questions. Welcome to [facility]!

## 2024-07-08 ENCOUNTER — Ambulatory Visit: Payer: Self-pay | Admitting: General Practice

## 2024-07-08 LAB — TSH: TSH: 0.58 u[IU]/mL (ref 0.35–5.50)

## 2024-07-08 LAB — HEPATITIS C ANTIBODY: Hepatitis C Ab: NONREACTIVE

## 2024-07-08 LAB — LIPASE: Lipase: 12 U/L (ref 11.0–59.0)

## 2024-08-13 ENCOUNTER — Encounter: Payer: Self-pay | Admitting: General Practice

## 2024-08-13 ENCOUNTER — Ambulatory Visit (INDEPENDENT_AMBULATORY_CARE_PROVIDER_SITE_OTHER): Admitting: General Practice

## 2024-08-13 VITALS — BP 120/62 | HR 94 | Temp 98.1°F | Ht 66.1 in | Wt 133.0 lb

## 2024-08-13 DIAGNOSIS — Z23 Encounter for immunization: Secondary | ICD-10-CM | POA: Diagnosis not present

## 2024-08-13 DIAGNOSIS — Z0000 Encounter for general adult medical examination without abnormal findings: Secondary | ICD-10-CM | POA: Diagnosis not present

## 2024-08-13 NOTE — Patient Instructions (Addendum)
 Stop by the lab prior to leaving today. I will notify you of your results once received.   Follow up in one year for physical.   It was a pleasure to see you today!

## 2024-08-13 NOTE — Progress Notes (Signed)
 Established Patient Office Visit  Subjective   Patient ID: Henry Bailey, male    DOB: 24-Nov-2000  Age: 23 y.o. MRN: [id number]  Chief Complaint  Patient presents with   Annual Exam    HPI  Henry Bailey is a 23 year old male with past medical history of lipase presents today for complete physical and follow up of chronic conditions.  Immunizations: -Tetanus: Completed in 2011; due  -Influenza: declines - Pneumonia: due   Diet: Fair diet.  Exercise:  regular exercise.   Eye exam: Completed several years ago.  Dental exam: Completed several years ago.     Patient Active Problem List   Diagnosis Date Noted   Encounter for screening and preventative care 08/13/2024   Establishing care with new doctor, encounter for 07/07/2024   Heat intolerance 07/07/2024   Past Medical History:  Diagnosis Date   Frequent headaches    History reviewed. No pertinent surgical history. Allergies  Allergen Reactions   Penicillins Hives         08/13/2024    9:34 AM 07/07/2024    2:08 PM  Depression screen PHQ 2/9  Decreased Interest 0 0  Down, Depressed, Hopeless 0 0  PHQ - 2 Score 0 0  Altered sleeping 0 0  Tired, decreased energy 0 0  Change in appetite 0 0  Feeling bad or failure about yourself  0 0  Trouble concentrating 0 0  Moving slowly or fidgety/restless 0 0  Suicidal thoughts 0 0  PHQ-9 Score 0 0   Difficult doing work/chores Not difficult at all Not difficult at all     Data saved with a previous flowsheet row definition       08/13/2024    9:34 AM 07/07/2024    2:09 PM  GAD 7 : Generalized Anxiety Score  Nervous, Anxious, on Edge 0 0  Control/stop worrying 0 0  Worry too much - different things 0 0  Trouble relaxing 0 0  Restless 0 0  Easily annoyed or irritable 0 0  Afraid - awful might happen 0 0  Total GAD 7 Score 0 0  Anxiety Difficulty Not difficult at all Not difficult at all      Review of Systems  Constitutional:  Negative  for chills, fever, malaise/fatigue and weight loss.  HENT:  Negative for congestion, ear discharge, ear pain, hearing loss, nosebleeds, sinus pain, sore throat and tinnitus.   Eyes:  Negative for blurred vision, double vision, pain, discharge and redness.  Respiratory:  Negative for cough, shortness of breath, wheezing and stridor.   Cardiovascular:  Negative for chest pain, palpitations and leg swelling.  Gastrointestinal:  Negative for abdominal pain, constipation, diarrhea, heartburn, nausea and vomiting.  Genitourinary:  Negative for dysuria, frequency and urgency.  Musculoskeletal:  Negative for myalgias.  Skin:  Negative for rash.  Neurological:  Negative for dizziness, tingling, seizures, weakness and headaches.  Endo/Heme/Allergies:  Negative for polydipsia.  Psychiatric/Behavioral:  Negative for depression, substance abuse and suicidal ideas. The patient is not nervous/anxious.       Objective:     BP 120/62   Pulse 94   Temp 98.1 F (36.7 C) (Oral)   Ht 5' 6.1 (1.679 m)   Wt 133 lb (60.3 kg)   SpO2 96%   BMI 21.40 kg/m  BP Readings from Last 3 Encounters:  08/13/24 120/62  07/07/24 120/72  05/18/24 105/64   Wt Readings from Last 3 Encounters:  08/13/24 133 lb (60.3 kg)  07/07/24 134 lb (60.8 kg)  05/18/24 138 lb (62.6 kg)      Physical Exam Vitals and nursing note reviewed.  Constitutional:      Appearance: Normal appearance.  HENT:     Head: Normocephalic and atraumatic.     Right Ear: Tympanic membrane, ear canal and external ear normal.     Left Ear: Tympanic membrane, ear canal and external ear normal.     Nose: Nose normal.     Mouth/Throat:     Mouth: Mucous membranes are moist.     Pharynx: Oropharynx is clear.  Eyes:     Conjunctiva/sclera: Conjunctivae normal.     Pupils: Pupils are equal, round, and reactive to light.  Cardiovascular:     Rate and Rhythm: Normal rate and regular rhythm.     Pulses: Normal pulses.     Heart sounds: Normal  heart sounds.  Pulmonary:     Effort: Pulmonary effort is normal.     Breath sounds: Normal breath sounds.  Abdominal:     General: Abdomen is flat. Bowel sounds are normal.     Palpations: Abdomen is soft.  Musculoskeletal:        General: Normal range of motion.     Cervical back: Normal range of motion.  Skin:    General: Skin is warm and dry.     Capillary Refill: Capillary refill takes less than 2 seconds.  Neurological:     General: No focal deficit present.     Mental Status: He is alert and oriented to person, place, and time. Mental status is at baseline.  Psychiatric:        Mood and Affect: Mood normal.        Behavior: Behavior normal.        Thought Content: Thought content normal.        Judgment: Judgment normal.      No results found for any visits on 08/13/24.     The ASCVD Risk score (Arnett DK, et al., 2019) failed to calculate for the following reasons:   The 2019 ASCVD risk score is only valid for ages 1 to 53    Assessment & Plan:  Encounter for screening and preventative care Assessment & Plan: Immunizations tdap and prevnar 20- given today. Influenza UTD.  Discussed the importance of a healthy diet and regular exercise in order for weight loss, and to reduce the risk of further co-morbidity.  Exam stable. Labs reviewed.   Follow up in 1 year for repeat physical.    Need for Tdap vaccination -     Tdap vaccine greater than or equal to 7yo IM  Need for pneumococcal 20-valent conjugate vaccination -     Pneumococcal conjugate vaccine 20-valent     Return in about 1 year (around 08/13/2025) for physical and labs.SABRA Carrol Aurora, NP

## 2024-08-13 NOTE — Assessment & Plan Note (Signed)
 Immunizations tdap and prevnar 20- given today. Influenza UTD.  Discussed the importance of a healthy diet and regular exercise in order for weight loss, and to reduce the risk of further co-morbidity.  Exam stable. Labs reviewed.   Follow up in 1 year for repeat physical.

## 2025-08-15 ENCOUNTER — Encounter: Admitting: General Practice
